# Patient Record
Sex: Male | Born: 2000 | Race: White | Hispanic: No | Marital: Single | Smoking: Never smoker
Health system: Southern US, Community
[De-identification: ages and names within clinical notes are randomized; demographics above are authoritative.]

---
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
## 2020-06-10 DIAGNOSIS — Z419 Encounter for procedure for purposes other than remedying health state, unspecified: Secondary | ICD-10-CM | POA: Diagnosis not present

## 2020-07-11 DIAGNOSIS — Z419 Encounter for procedure for purposes other than remedying health state, unspecified: Secondary | ICD-10-CM | POA: Diagnosis not present

## 2020-08-11 DIAGNOSIS — Z419 Encounter for procedure for purposes other than remedying health state, unspecified: Secondary | ICD-10-CM | POA: Diagnosis not present

## 2020-09-10 DIAGNOSIS — Z419 Encounter for procedure for purposes other than remedying health state, unspecified: Secondary | ICD-10-CM | POA: Diagnosis not present

## 2020-10-11 DIAGNOSIS — Z419 Encounter for procedure for purposes other than remedying health state, unspecified: Secondary | ICD-10-CM | POA: Diagnosis not present

## 2020-11-10 DIAGNOSIS — Z419 Encounter for procedure for purposes other than remedying health state, unspecified: Secondary | ICD-10-CM | POA: Diagnosis not present

## 2020-12-11 DIAGNOSIS — Z419 Encounter for procedure for purposes other than remedying health state, unspecified: Secondary | ICD-10-CM | POA: Diagnosis not present

## 2021-01-11 DIAGNOSIS — Z419 Encounter for procedure for purposes other than remedying health state, unspecified: Secondary | ICD-10-CM | POA: Diagnosis not present

## 2021-02-08 DIAGNOSIS — Z419 Encounter for procedure for purposes other than remedying health state, unspecified: Secondary | ICD-10-CM | POA: Diagnosis not present

## 2021-03-11 DIAGNOSIS — Z419 Encounter for procedure for purposes other than remedying health state, unspecified: Secondary | ICD-10-CM | POA: Diagnosis not present

## 2021-04-10 DIAGNOSIS — Z419 Encounter for procedure for purposes other than remedying health state, unspecified: Secondary | ICD-10-CM | POA: Diagnosis not present

## 2021-05-11 DIAGNOSIS — Z419 Encounter for procedure for purposes other than remedying health state, unspecified: Secondary | ICD-10-CM | POA: Diagnosis not present

## 2021-06-10 DIAGNOSIS — Z419 Encounter for procedure for purposes other than remedying health state, unspecified: Secondary | ICD-10-CM | POA: Diagnosis not present

## 2021-07-11 DIAGNOSIS — Z419 Encounter for procedure for purposes other than remedying health state, unspecified: Secondary | ICD-10-CM | POA: Diagnosis not present

## 2021-08-11 DIAGNOSIS — Z419 Encounter for procedure for purposes other than remedying health state, unspecified: Secondary | ICD-10-CM | POA: Diagnosis not present

## 2021-09-10 DIAGNOSIS — Z419 Encounter for procedure for purposes other than remedying health state, unspecified: Secondary | ICD-10-CM | POA: Diagnosis not present

## 2021-10-11 DIAGNOSIS — Z419 Encounter for procedure for purposes other than remedying health state, unspecified: Secondary | ICD-10-CM | POA: Diagnosis not present

## 2021-11-10 DIAGNOSIS — Z419 Encounter for procedure for purposes other than remedying health state, unspecified: Secondary | ICD-10-CM | POA: Diagnosis not present

## 2021-12-11 DIAGNOSIS — Z419 Encounter for procedure for purposes other than remedying health state, unspecified: Secondary | ICD-10-CM | POA: Diagnosis not present

## 2022-01-11 DIAGNOSIS — Z419 Encounter for procedure for purposes other than remedying health state, unspecified: Secondary | ICD-10-CM | POA: Diagnosis not present

## 2022-02-08 DIAGNOSIS — Z419 Encounter for procedure for purposes other than remedying health state, unspecified: Secondary | ICD-10-CM | POA: Diagnosis not present

## 2022-03-11 DIAGNOSIS — Z419 Encounter for procedure for purposes other than remedying health state, unspecified: Secondary | ICD-10-CM | POA: Diagnosis not present

## 2022-04-10 DIAGNOSIS — Z419 Encounter for procedure for purposes other than remedying health state, unspecified: Secondary | ICD-10-CM | POA: Diagnosis not present

## 2022-05-11 DIAGNOSIS — Z419 Encounter for procedure for purposes other than remedying health state, unspecified: Secondary | ICD-10-CM | POA: Diagnosis not present

## 2022-06-10 DIAGNOSIS — Z419 Encounter for procedure for purposes other than remedying health state, unspecified: Secondary | ICD-10-CM | POA: Diagnosis not present

## 2022-07-11 DIAGNOSIS — Z419 Encounter for procedure for purposes other than remedying health state, unspecified: Secondary | ICD-10-CM | POA: Diagnosis not present
